# Patient Record
Sex: Male | Born: 2005 | Race: Black or African American | Hispanic: No | Marital: Single | Smoking: Never smoker
Health system: Southern US, Community
[De-identification: ages and names within clinical notes are randomized; demographics above are authoritative.]

---
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
## 2023-07-26 ENCOUNTER — Encounter (HOSPITAL_BASED_OUTPATIENT_CLINIC_OR_DEPARTMENT_OTHER): Payer: Self-pay

## 2023-07-26 ENCOUNTER — Emergency Department (HOSPITAL_BASED_OUTPATIENT_CLINIC_OR_DEPARTMENT_OTHER)
Admission: EM | Admit: 2023-07-26 | Discharge: 2023-07-26 | Disposition: A | Discharge status: Home / Self Care | Payer: Medicaid Other | Attending: Emergency Medicine | Admitting: Emergency Medicine

## 2023-07-26 ENCOUNTER — Other Ambulatory Visit: Payer: Self-pay

## 2023-07-26 DIAGNOSIS — Z202 Contact with and (suspected) exposure to infections with a predominantly sexual mode of transmission: Secondary | ICD-10-CM | POA: Diagnosis present

## 2023-07-26 NOTE — Discharge Instructions (Signed)
Today you were seen for possible STI exposure.  You have 1 or more labs pending.  You will receive a call on these labs results.  Thank you for letting us treat you today. After performing a physical exam, I feel you are safe to go home. Please follow up with your PCP in the next several days and provide them with your records from this visit. Return to the Emergency Room if pain becomes severe or symptoms worsen.

## 2023-07-26 NOTE — ED Triage Notes (Signed)
Pt requests chlamydia test. States he tested positive in October, "want to make sure it's gone." Advises urge to urinate, but denies pain/ discharge.

## 2023-07-26 NOTE — ED Provider Notes (Signed)
[place] EMERGENCY DEPARTMENT AT [facility] Provider Note   CSN: [id number] Arrival date & time: 07/26/23  1418     History  No chief complaint on file.   Francis Shepherd is a 17 y.o. male presents today after having a positive chlamydia test in October.  Patient denies pain/discharge.  Patient denies fever, chills, nausea, vomiting, or pelvic pain.  Patient did take antibiotics but does not remember which ones.  HPI     Home Medications Prior to Admission medications   Medication Sig Start Date End Date Taking? Authorizing Provider  sulfamethoxazole-trimethoprim (BACTRIM) 400-80 MG tablet Take 1 tablet by mouth 2 (two) times daily. 08/31/15   Palumbo, April, MD      Allergies    Patient has no known allergies.    Review of Systems   Review of Systems  Physical Exam Updated Vital Signs BP 131/68   Pulse 85   Temp 98.1 F (36.7 C)   Resp 16   SpO2 100%  Physical Exam Vitals and nursing note reviewed.  Constitutional:      General: He is not in acute distress.    Appearance: He is well-developed.  HENT:     Head: Normocephalic and atraumatic.     Right Ear: External ear normal.     Left Ear: External ear normal.     Nose: Nose normal.  Eyes:     Extraocular Movements: Extraocular movements intact.     Conjunctiva/sclera: Conjunctivae normal.  Cardiovascular:     Rate and Rhythm: Normal rate and regular rhythm.     Heart sounds: No murmur heard. Pulmonary:     Effort: Pulmonary effort is normal. No respiratory distress.     Breath sounds: Normal breath sounds.  Abdominal:     Palpations: Abdomen is soft.  Musculoskeletal:        General: No swelling.     Cervical back: Normal range of motion and neck supple.  Skin:    General: Skin is warm and dry.     Capillary Refill: Capillary refill takes less than 2 seconds.  Neurological:     General: No focal deficit present.     Mental Status: He is alert.  Psychiatric:        Mood and Affect: Mood  normal.     ED Results / Procedures / Treatments   Labs (all labs ordered are listed, but only abnormal results are displayed) Labs Reviewed  GC/CHLAMYDIA PROBE AMP ([place]) NOT AT [facility]    EKG None  Radiology No results found.  Procedures Procedures    Medications Ordered in ED Medications - No data to display  ED Course/ Medical Decision Making/ A&P                                 Medical Decision Making  This patient presents to the ED with chief complaint(s) of none with pertinent past medical history of chlamydia which further complicates the presenting complaint. The complaint involves an extensive differential diagnosis and also carries with it a high risk of complications and morbidity.    The differential diagnosis includes gonorrhea, chlamydia   ED Course and Reassessment:   Independent labs interpretation:  The following labs were independently interpreted:  GC chlamydia: Pending  Consultation: - Consulted or discussed management/test interpretation w/ external professional: None  Consideration for admission or further workup: Considered for admission or further workup however patient has no complaints  at this time.  Patient's vital signs and physical exam of been reassuring.  Patient will be notified when his pending test results and if any treatment is needed.        Final Clinical Impression(s) / ED Diagnoses Final diagnoses:  Possible exposure to STI    Rx / DC Orders ED Discharge Orders     None         Dolphus Jenny, PA-C 07/26/23 1513    Benjiman Core, MD 07/29/23 1440

## 2023-07-26 NOTE — ED Notes (Signed)
Urine sample collected and sent to lab.

## 2023-07-28 ENCOUNTER — Other Ambulatory Visit: Payer: Self-pay

## 2023-07-28 ENCOUNTER — Emergency Department (HOSPITAL_BASED_OUTPATIENT_CLINIC_OR_DEPARTMENT_OTHER)
Admission: EM | Admit: 2023-07-28 | Discharge: 2023-07-28 | Disposition: A | Discharge status: Home / Self Care | Payer: Medicaid Other | Attending: Emergency Medicine | Admitting: Emergency Medicine

## 2023-07-28 DIAGNOSIS — A64 Unspecified sexually transmitted disease: Secondary | ICD-10-CM | POA: Diagnosis present

## 2023-07-28 MED ORDER — CEFTRIAXONE SODIUM 500 MG IJ SOLR
500.0000 mg | Freq: Once | INTRAMUSCULAR | Status: AC
  Administered 2023-07-28: 500 mg via INTRAMUSCULAR
  Filled 2023-07-28: qty 500

## 2023-07-28 MED ORDER — DOXYCYCLINE HYCLATE 100 MG PO TABS
100.0000 mg | ORAL_TABLET | Freq: Once | ORAL | Status: AC
  Administered 2023-07-28: 100 mg via ORAL
  Filled 2023-07-28: qty 1

## 2023-07-28 MED ORDER — DOXYCYCLINE HYCLATE 100 MG PO TABS: 100.0000 mg | ORAL_TABLET | Freq: Two times a day (BID) | ORAL | 0 refills | Status: AC

## 2023-07-28 MED ORDER — LIDOCAINE HCL (PF) 1 % IJ SOLN
1.0000 mL | Freq: Once | INTRAMUSCULAR | Status: AC
  Administered 2023-07-28: 1.2 mL
  Filled 2023-07-28: qty 5

## 2023-07-28 NOTE — ED Triage Notes (Signed)
Pt POV from home reporting genital pain, seen 12/15 for possible STI, no meds prescribed, requesting results today.

## 2023-07-28 NOTE — Discharge Instructions (Addendum)
As discussed, your results still are not back yet but we will treat you prophylactically for STDs.  Given a shot of Rocephin today and the first dose of doxycycline.  Continue taking the doxycycline twice a day for the next week.  Please take this medication with food as it can cause nausea.  Make sure to complete the full course of antibiotics to ensure resolution of infection.  Abstain from sex until you complete the medication.  Have your partners get tested as well.  Return to the ED if your symptoms worsen in the interim.

## 2023-07-28 NOTE — ED Provider Notes (Signed)
[place] EMERGENCY DEPARTMENT AT [facility] Provider Note   CSN: [id number] Arrival date & time: 07/28/23  1900     History  No chief complaint on file.   Francis Shepherd is a 17 y.o. male with no significant past medical history who presents the ED today to follow-up for possible STI.  Patient was seen on 12/13 and tested for gonorrhea and chlamydia.  He is here today requesting the results.  Patient reports persistent penile pain without discharge or pain/difficulty urinating.  He states that his symptoms have not worsened since he was last seen but would just like to be treated.  No complaints or concerns at this time.    Home Medications Prior to Admission medications   Medication Sig Start Date End Date Taking? Authorizing Provider  doxycycline (VIBRA-TABS) 100 MG tablet Take 1 tablet (100 mg total) by mouth 2 (two) times daily. 07/28/23  Yes Maxwell Marion, PA-C  sulfamethoxazole-trimethoprim (BACTRIM) 400-80 MG tablet Take 1 tablet by mouth 2 (two) times daily. 08/31/15   Palumbo, April, MD      Allergies    Patient has no known allergies.    Review of Systems   Review of Systems  Genitourinary:  Positive for penile pain.  All other systems reviewed and are negative.   Physical Exam Updated Vital Signs BP 110/68   Pulse 64   Temp 98.1 F (36.7 C) (Oral)   Resp 15   Ht 5\' 7"  (1.702 m)   Wt 68 kg   SpO2 99%   BMI 23.49 kg/m  Physical Exam Vitals and nursing note reviewed.  Constitutional:      General: He is not in acute distress.    Appearance: Normal appearance. He is well-developed.  HENT:     Head: Normocephalic and atraumatic.  Eyes:     Conjunctiva/sclera: Conjunctivae normal.  Cardiovascular:     Rate and Rhythm: Normal rate and regular rhythm.     Heart sounds: Normal heart sounds.  Pulmonary:     Effort: Pulmonary effort is normal.     Breath sounds: Normal breath sounds.  Abdominal:     Palpations: Abdomen is soft.     Tenderness:  There is no abdominal tenderness.  Musculoskeletal:        General: Normal range of motion.     Cervical back: Normal range of motion.  Skin:    General: Skin is warm and dry.     Capillary Refill: Capillary refill takes less than 2 seconds.  Neurological:     Mental Status: He is alert.  Psychiatric:        Mood and Affect: Mood normal.    ED Results / Procedures / Treatments   Labs (all labs ordered are listed, but only abnormal results are displayed) Labs Reviewed - No data to display  EKG None  Radiology No results found.  Procedures Procedures: not indicated.   Medications Ordered in ED Medications  cefTRIAXone (ROCEPHIN) injection 500 mg (500 mg Intramuscular Given 07/28/23 2018)  lidocaine (PF) (XYLOCAINE) 1 % injection 1-2.1 mL (1.2 mLs Other Given 07/28/23 2018)  doxycycline (VIBRA-TABS) tablet 100 mg (100 mg Oral Given 07/28/23 2018)    ED Course/ Medical Decision Making/ A&P                                 Medical Decision Making Risk Prescription drug management.   This patient presents to the ED for  concern of STI, this involves an extensive number of treatment options, and is a complaint that carries with it a high risk of complications and morbidity.   Differential diagnosis includes: Gonorrhea, chlamydia, etc.   Comorbidities  No significant past medical history   Additional History  Additional history obtained from previous ED note.   Problem List / ED Course / Critical Interventions / Medication Management  STI results I informed patient that the results are not back yet but I told him that we will treat him prophylactically for the STIs since he is here.  Patient is agreeable with this plan. I ordered medications including: Rocephin and doxycycline for gonorrhea/chlamydia I have reviewed the patients home medicines and have made adjustments as needed   Social Determinants of Health  Social connection   Test / Admission -  Considered  Informed patient of the results of his prior STI testing are not back.  Informed her that it takes a couple days for them to resolve. With shared decision making, we decided to go ahead and treat patient prophylactically. Prescription for doxycycline sent to the pharmacy. Return precautions provided.       Final Clinical Impression(s) / ED Diagnoses Final diagnoses:  STI (sexually transmitted infection)    Rx / DC Orders ED Discharge Orders          Ordered    doxycycline (VIBRA-TABS) 100 MG tablet  2 times daily        07/28/23 2013              Maxwell Marion, PA-C 07/28/23 2025    Arby Barrette, MD 08/02/23 1343

## 2023-07-29 LAB — GC/CHLAMYDIA PROBE AMP (~~LOC~~) NOT AT ARMC
Chlamydia: NEGATIVE
Comment: NEGATIVE
Comment: NORMAL
Neisseria Gonorrhea: NEGATIVE
